# Patient Record
Sex: Female | Born: 1980 | Race: Black or African American | Hispanic: No | Marital: Single | Smoking: Never smoker
Health system: Southern US, Community
[De-identification: ages and names within clinical notes are randomized; demographics above are authoritative.]

## PROBLEM LIST (undated history)

## (undated) DIAGNOSIS — J45909 Unspecified asthma, uncomplicated: Secondary | ICD-10-CM

## (undated) HISTORY — PX: DILATION AND CURETTAGE OF UTERUS: SHX78

## (undated) HISTORY — PX: TONSILLECTOMY: SUR1361

---

## 2011-10-17 ENCOUNTER — Emergency Department (HOSPITAL_COMMUNITY): Payer: Medicaid Other

## 2011-10-17 ENCOUNTER — Encounter (HOSPITAL_COMMUNITY): Payer: Self-pay | Admitting: *Deleted

## 2011-10-17 ENCOUNTER — Other Ambulatory Visit: Payer: Self-pay

## 2011-10-17 ENCOUNTER — Emergency Department (HOSPITAL_COMMUNITY)
Admission: EM | Admit: 2011-10-17 | Discharge: 2011-10-18 | Disposition: A | Discharge status: Home / Self Care | Payer: Medicaid Other | Attending: Emergency Medicine | Admitting: Emergency Medicine

## 2011-10-17 DIAGNOSIS — R05 Cough: Secondary | ICD-10-CM | POA: Insufficient documentation

## 2011-10-17 DIAGNOSIS — J40 Bronchitis, not specified as acute or chronic: Secondary | ICD-10-CM

## 2011-10-17 DIAGNOSIS — R0602 Shortness of breath: Secondary | ICD-10-CM | POA: Insufficient documentation

## 2011-10-17 DIAGNOSIS — R42 Dizziness and giddiness: Secondary | ICD-10-CM | POA: Insufficient documentation

## 2011-10-17 DIAGNOSIS — R059 Cough, unspecified: Secondary | ICD-10-CM | POA: Insufficient documentation

## 2011-10-17 LAB — DIFFERENTIAL
Eosinophils Absolute: 0.1 10*3/uL (ref 0.0–0.7)
Eosinophils Relative: 2 % (ref 0–5)
Lymphocytes Relative: 22 % (ref 12–46)
Lymphs Abs: 0.9 10*3/uL (ref 0.7–4.0)
Monocytes Relative: 8 % (ref 3–12)

## 2011-10-17 LAB — CBC
HCT: 37.1 % (ref 36.0–46.0)
Hemoglobin: 13.2 g/dL (ref 12.0–15.0)
MCH: 30.6 pg (ref 26.0–34.0)
MCV: 85.9 fL (ref 78.0–100.0)
Platelets: 238 10*3/uL (ref 150–400)
RBC: 4.32 MIL/uL (ref 3.87–5.11)
WBC: 4.2 10*3/uL (ref 4.0–10.5)

## 2011-10-17 LAB — BASIC METABOLIC PANEL
BUN: 4 mg/dL — ABNORMAL LOW (ref 6–23)
CO2: 26 mEq/L (ref 19–32)
Calcium: 9.2 mg/dL (ref 8.4–10.5)
GFR calc non Af Amer: >90 mL/min (ref >90)
Glucose, Bld: 112 mg/dL — ABNORMAL HIGH (ref 70–99)
Potassium: 4.2 mEq/L (ref 3.5–5.1)
Sodium: 134 mEq/L — ABNORMAL LOW (ref 135–145)

## 2011-10-17 NOTE — ED Provider Notes (Signed)
History     CSN: [id number]  Arrival date & time 10/17/11  2129   First MD Initiated Contact with Patient 10/17/11 2155      Chief Complaint  Patient presents with  . Cough    (Consider location/radiation/quality/duration/timing/severity/associated sxs/prior treatment) HPI Comments: Patient presents with 3 weeks of cough, congestion, headache, chills. She has not been seen for these complaints. Her when her house is having symptoms which is not improved. Her cough is productive of yellowish mucus associated with anterior chest pain with coughing as well as chest soreness throughout the day. She denies any nausea, vomiting abdominal pain, back pain. He does not smoke. She is on birth control, denies any leg pain or swelling. She does see congestion, facial pain, sore throat, body aches and chills.  The history is provided by the patient.    History reviewed. No pertinent past medical history.  History reviewed. No pertinent past surgical history.  No family history on file.  History  Substance Use Topics  . Smoking status: Never Smoker   . Smokeless tobacco: Not on file  . Alcohol Use: Yes    OB History    Grav Para Term Preterm Abortions TAB SAB Ect Mult Living                  Review of Systems  Constitutional: Positive for chills, activity change, appetite change and fatigue.  HENT: Positive for congestion, sore throat, rhinorrhea and sinus pressure. Negative for neck pain and neck stiffness.   Eyes: Negative for visual disturbance.  Respiratory: Positive for cough and shortness of breath.   Cardiovascular: Negative for chest pain.  Gastrointestinal: Negative for nausea, vomiting and abdominal pain.  Genitourinary: Negative for dysuria and hematuria.  Musculoskeletal: Positive for myalgias and arthralgias. Negative for back pain.  Skin: Negative for rash.  Neurological: Positive for light-headedness and headaches.    Allergies  Review of patient's allergies  indicates no known allergies.  Home Medications   Current Outpatient Rx  Name Route Sig Dispense Refill  . NORGESTREL-ETHINYL ESTRADIOL 0.3-30 MG-MCG PO TABS Oral Take 1 tablet by mouth daily.    . ALBUTEROL SULFATE HFA 108 (90 BASE) MCG/ACT IN AERS Inhalation Inhale 1-2 puffs into the lungs every 6 (six) hours as needed for wheezing. 1 Inhaler 0  . AZITHROMYCIN 250 MG PO TABS Oral Take 1 tablet (250 mg total) by mouth daily. Take first 2 tablets together, then 1 every day until finished. 6 tablet 0  . BENZONATATE 200 MG PO CAPS Oral Take 1 capsule (200 mg total) by mouth 3 (three) times daily as needed for cough. 20 capsule 0    BP 124/81  Pulse 116  Temp(Src) 99.9 F (37.7 C) (Oral)  Resp 20  SpO2 99%  LMP 09/29/2011  Physical Exam  Constitutional: She is oriented to person, place, and time. She appears well-developed and well-nourished. No distress.  HENT:  Head: Normocephalic and atraumatic.  Right Ear: External ear normal.  Left Ear: External ear normal.  Mouth/Throat: Oropharynx is clear and moist. No oropharyngeal exudate.  Eyes: Conjunctivae are normal. Pupils are equal, round, and reactive to light.  Neck: Normal range of motion.       No meningismus  Cardiovascular: Normal rate, regular rhythm and normal heart sounds.   Pulmonary/Chest: Effort normal and breath sounds normal. No respiratory distress.  Abdominal: Soft. There is no tenderness. There is no rebound and no guarding.  Musculoskeletal: Normal range of motion. She exhibits no edema and  no tenderness.  Neurological: She is alert and oriented to person, place, and time. No cranial nerve deficit.  Skin: Skin is warm.    ED Course  Procedures (including critical care time)  Labs Reviewed  BASIC METABOLIC PANEL - Abnormal; Notable for the following:    Sodium 134 (*)    Glucose, Bld 112 (*)    BUN 4 (*)    All other components within normal limits  D-DIMER, QUANTITATIVE - Abnormal; Notable for the  following:    D-Dimer, Quant 0.69 (*)    All other components within normal limits  CBC  DIFFERENTIAL   Dg Chest 2 View  10/17/2011  *RADIOLOGY REPORT*  Clinical Data: Cough, congestion, shortness of breath, chest pressure  CHEST - 2 VIEW  Comparison: None  Findings: Normal heart size, mediastinal contours, and pulmonary vascularity. Lungs minimally hyperexpanded but clear. No pleural effusion or pneumothorax. Bilateral nipple shadows.  IMPRESSION: Minimally hyperexpanded lungs without infiltrate.  Original Report Authenticated By: Lollie Marrow, M.D.     1. Bronchitis       MDM  Cough, congestion, body aches and chills. Tachycardia on exam with clear lungs.   Suspect bronchitis given URI symptoms, cough. However with tachycardia, SOB, chest pain, and OCP use, Ddimer sent. Elevated.  CTPE pending at sign out to Dr. Lynelle Doctor.   Date: 10/17/2011  Rate: 105  Rhythm: sinus tachycardia  QRS Axis: normal  Intervals: normal  ST/T Wave abnormalities: normal  Conduction Disutrbances:none  Narrative Interpretation:   Old EKG Reviewed: none available          Glynn Octave, MD 10/18/11 [phone]

## 2011-10-17 NOTE — ED Notes (Signed)
Pt co very dry sore throat and has been sick on and off for 3 weeks.  Pain in center of chest that feels muscular to her and is intensified with coughing.  Pt states she had temp of 101 most of the day.  Alert oriented X4

## 2011-10-17 NOTE — ED Notes (Signed)
She has has a cold with coughing and she has a headache and chest congestion for 3 weeks.  Productive cough

## 2011-10-18 ENCOUNTER — Emergency Department (HOSPITAL_COMMUNITY): Payer: Medicaid Other

## 2011-10-18 MED ORDER — ALBUTEROL SULFATE HFA 108 (90 BASE) MCG/ACT IN AERS: 1.0000 | INHALATION_SPRAY | Freq: Four times a day (QID) | RESPIRATORY_TRACT | Status: DC | PRN

## 2011-10-18 MED ORDER — IOHEXOL 300 MG/ML  SOLN
80.0000 mL | Freq: Once | INTRAMUSCULAR | Status: AC | PRN
  Administered 2011-10-18: 80 mL via INTRAVENOUS

## 2011-10-18 MED ORDER — ACETAMINOPHEN 325 MG PO TABS
650.0000 mg | ORAL_TABLET | Freq: Once | ORAL | Status: AC
  Administered 2011-10-18: 650 mg via ORAL
  Filled 2011-10-18: qty 2

## 2011-10-18 MED ORDER — AZITHROMYCIN 250 MG PO TABS: 250.0000 mg | ORAL_TABLET | Freq: Every day | ORAL | Status: AC

## 2011-10-18 MED ORDER — BENZONATATE 200 MG PO CAPS: 200.0000 mg | ORAL_CAPSULE | Freq: Three times a day (TID) | ORAL | Status: AC | PRN

## 2011-10-18 NOTE — Discharge Instructions (Signed)

## 2011-10-18 NOTE — ED Provider Notes (Signed)
Pt turned over at change of shift to get CT angio chest results which are consistant with bronchitis (Pt low risk for bronchogenic cancer). Pt states she feels good enough to go home now.  Dg Chest 2 View  10/17/2011  *RADIOLOGY REPORT*  Clinical Data: Cough, congestion, shortness of breath, chest pressure  CHEST - 2 VIEW  Comparison: None  Findings: Normal heart size, mediastinal contours, and pulmonary vascularity. Lungs minimally hyperexpanded but clear. No pleural effusion or pneumothorax. Bilateral nipple shadows.  IMPRESSION: Minimally hyperexpanded lungs without infiltrate.  Original Report Authenticated By: Lollie Marrow, M.D.   Ct Angio Chest W/cm &/or Wo Cm  10/18/2011  *RADIOLOGY REPORT*  Clinical Data: Cough, question pulmonary embolism  CT ANGIOGRAPHY CHEST  Technique:  Multidetector CT imaging of the chest using the standard protocol during bolus administration of intravenous contrast. Multiplanar reconstructed images including MIPs were obtained and reviewed to evaluate the vascular anatomy.  Contrast: 80mL OMNIPAQUE IOHEXOL 300 MG/ML IJ SOLN  Comparison: None  Findings: Aorta normal caliber without aneurysm or dissection. Visualized portion of upper abdomen unremarkable. Pulmonary arteries patent. No evidence of pulmonary embolism. Scattered peribronchial thickening. Minimal patchy peribronchovascular infiltrate right lung and left lower lobe. Subpleural atelectasis right lower lobe. Few tiny nonspecific subpleural nodules 4 mm diameter or less. Bones unremarkable.  IMPRESSION: No evidence of pulmonary embolism. Scattered peribronchial thickening with minimal scattered pulmonary infiltrate. Tiny nodular foci left lung loss of 4 mm diameter, recommendation below.  If the patient is at high risk for bronchogenic carcinoma, follow- up chest CT at 1 year is recommended.  If the patient is at low risk, no follow-up is needed.  This recommendation follows the consensus statement: Guidelines for  Management of Small Pulmonary Nodules Detected on CT Scans:  A Statement from the Fleischner Society as published in Radiology 2005; 237:395-400.  Original Report Authenticated By: Lollie Marrow, M.D.    Diagnoses that have been ruled out:  None  Diagnoses that are still under consideration:  None  Final diagnoses:  Bronchitis   New Prescriptions   ALBUTEROL (PROVENTIL HFA;VENTOLIN HFA) 108 (90 BASE) MCG/ACT INHALER    Inhale 1-2 puffs into the lungs every 6 (six) hours as needed for wheezing.   AZITHROMYCIN (ZITHROMAX) 250 MG TABLET    Take 1 tablet (250 mg total) by mouth daily. Take first 2 tablets together, then 1 every day until finished.   BENZONATATE (TESSALON) 200 MG CAPSULE    Take 1 capsule (200 mg total) by mouth 3 (three) times daily as needed for cough.   Plan discharge Devoria Albe, MD, Franz Dell, MD 10/18/11 [phone]

## 2012-08-07 ENCOUNTER — Emergency Department (HOSPITAL_BASED_OUTPATIENT_CLINIC_OR_DEPARTMENT_OTHER)
Admission: EM | Admit: 2012-08-07 | Discharge: 2012-08-07 | Disposition: A | Discharge status: Home / Self Care | Payer: Medicaid Other | Attending: Emergency Medicine | Admitting: Emergency Medicine

## 2012-08-07 ENCOUNTER — Encounter (HOSPITAL_BASED_OUTPATIENT_CLINIC_OR_DEPARTMENT_OTHER): Payer: Self-pay | Admitting: *Deleted

## 2012-08-07 DIAGNOSIS — B349 Viral infection, unspecified: Secondary | ICD-10-CM

## 2012-08-07 DIAGNOSIS — Z9889 Other specified postprocedural states: Secondary | ICD-10-CM | POA: Insufficient documentation

## 2012-08-07 DIAGNOSIS — Z8701 Personal history of pneumonia (recurrent): Secondary | ICD-10-CM | POA: Insufficient documentation

## 2012-08-07 DIAGNOSIS — Z8709 Personal history of other diseases of the respiratory system: Secondary | ICD-10-CM | POA: Insufficient documentation

## 2012-08-07 DIAGNOSIS — J069 Acute upper respiratory infection, unspecified: Secondary | ICD-10-CM | POA: Insufficient documentation

## 2012-08-07 DIAGNOSIS — IMO0002 Reserved for concepts with insufficient information to code with codable children: Secondary | ICD-10-CM | POA: Insufficient documentation

## 2012-08-07 DIAGNOSIS — IMO0001 Reserved for inherently not codable concepts without codable children: Secondary | ICD-10-CM | POA: Insufficient documentation

## 2012-08-07 DIAGNOSIS — R1310 Dysphagia, unspecified: Secondary | ICD-10-CM | POA: Insufficient documentation

## 2012-08-07 DIAGNOSIS — Z79899 Other long term (current) drug therapy: Secondary | ICD-10-CM | POA: Insufficient documentation

## 2012-08-07 DIAGNOSIS — R61 Generalized hyperhidrosis: Secondary | ICD-10-CM | POA: Insufficient documentation

## 2012-08-07 DIAGNOSIS — B9789 Other viral agents as the cause of diseases classified elsewhere: Secondary | ICD-10-CM | POA: Insufficient documentation

## 2012-08-07 LAB — RAPID STREP SCREEN (MED CTR MEBANE ONLY): Streptococcus, Group A Screen (Direct): NEGATIVE

## 2012-08-07 MED ORDER — PSEUDOEPHEDRINE HCL 60 MG PO TABS: 60.0000 mg | ORAL_TABLET | Freq: Four times a day (QID) | ORAL | Status: AC | PRN

## 2012-08-07 MED ORDER — ACETAMINOPHEN 500 MG PO TABS: 1000.0000 mg | ORAL_TABLET | Freq: Three times a day (TID) | ORAL | Status: AC | PRN

## 2012-08-07 MED ORDER — DIPHENHYDRAMINE HCL 25 MG PO CAPS: 25.0000 mg | ORAL_CAPSULE | Freq: Four times a day (QID) | ORAL | Status: DC | PRN

## 2012-08-07 MED ORDER — IBUPROFEN 800 MG PO TABS
800.0000 mg | ORAL_TABLET | Freq: Once | ORAL | Status: AC
  Administered 2012-08-07: 800 mg via ORAL
  Filled 2012-08-07: qty 1

## 2012-08-07 MED ORDER — IBUPROFEN 800 MG PO TABS: 800.0000 mg | ORAL_TABLET | Freq: Three times a day (TID) | ORAL | Status: DC | PRN

## 2012-08-07 NOTE — ED Notes (Signed)
MD at bedside. 

## 2012-08-07 NOTE — ED Provider Notes (Signed)
History   This chart was scribed for Jones Skene, MD by Sofie Rower, ED Scribe. The patient was seen in room MH07/MH07 and the patient's care was started at 7:22PM   CSN: [id number]  Arrival date & time 08/07/12  1836   First MD Initiated Contact with Patient 08/07/12 1922      Chief Complaint  Patient presents with  . URI    (Consider location/radiation/quality/duration/timing/severity/associated sxs/prior treatment) The history is provided by the patient. No language interpreter was used.    Loretta Warren is a 31 y.o. female with a hx of tonsillectomy, who presents to the Emergency Department complaining of sudden, progressively worsening, sore throat, onset four days ago (08/03/12).  Associated symptoms include myalgias, sweats and difficulty swallowing. The pt has taken motrin (200 mg X 2) which provides moderate relief of the sore throat. The pt has also taken antihistamine which does not provide relief of the sore throat. Modifying factors include swallowing which intensifies the sore throat. The pt has additional hx of acute pneumonia (diagnosed last year), acute bronchitis (diagnosed last year), cesarean section and dilation and curettage of uterus.   The pt denies cough, abdominal pain, nausea, and vomiting.  The pt does not smoke, however she does drink alcohol. The pt's LNMP was 07/11/12.     History reviewed. No pertinent past medical history.  Past Surgical History  Procedure Date  . Tonsillectomy   . Cesarean section   . Dilation and curettage of uterus     History reviewed. No pertinent family history.  History  Substance Use Topics  . Smoking status: Never Smoker   . Smokeless tobacco: Not on file  . Alcohol Use: Yes    OB History    Grav Para Term Preterm Abortions TAB SAB Ect Mult Living                  Review of Systems  At least 10pt or greater review of systems completed and are negative except where specified in the HPI.   Allergies   Review of patient's allergies indicates no known allergies.  Home Medications   Current Outpatient Rx  Name  Route  Sig  Dispense  Refill  . ALBUTEROL SULFATE HFA 108 (90 BASE) MCG/ACT IN AERS   Inhalation   Inhale 1-2 puffs into the lungs every 6 (six) hours as needed for wheezing.   1 Inhaler   0   . NORGESTREL-ETHINYL ESTRADIOL 0.3-30 MG-MCG PO TABS   Oral   Take 1 tablet by mouth daily.           BP 111/74  Pulse 119  Temp 99.5 F (37.5 C)  Resp 16  Ht 5\' 10"  (1.778 m)  Wt 145 lb (65.772 kg)  BMI 20.81 kg/m2  SpO2 100%  LMP 07/11/2012  Physical Exam  Nursing notes reviewed.  Electronic medical record reviewed. VITAL SIGNS:   Filed Vitals:   08/07/12 1842  BP: 111/74  Pulse: 119  Temp: 99.5 F (37.5 C)  Resp: 16  Height: 5\' 10"  (1.778 m)  Weight: 145 lb (65.772 kg)  SpO2: 100%   CONSTITUTIONAL: Awake, oriented, appears non-toxic HENT: Atraumatic, normocephalic, oral mucosa pink and moist, airway patent. Nasal congestion with clear nasal drainage. Pharynx mildly erythematous without exudates. External ears normal. EYES: Conjunctiva clear, EOMI, PERRLA NECK: Trachea midline, non-tender, supple, some mild anterior lymphadenopathy CARDIOVASCULAR: Normal heart rate, Normal rhythm, No murmurs, rubs, gallops PULMONARY/CHEST: Clear to auscultation, no rhonchi, wheezes, or rales. Symmetrical breath sounds.  Non-tender. ABDOMINAL: Non-distended, soft, non-tender - no rebound or guarding.  BS normal. NEUROLOGIC: Non-focal, moving all four extremities, no gross sensory or motor deficits. EXTREMITIES: No clubbing, cyanosis, or edema SKIN: Warm, Dry, No erythema, No rash  ED Course  Procedures (including critical care time)  DIAGNOSTIC STUDIES: Oxygen Saturation is 100% on room air, normal by my interpretation.    COORDINATION OF CARE:  7:43 PM- Treatment plan discussed with patient. Pt agrees with treatment.    Labs Reviewed  RAPID STREP SCREEN   Dg Chest  2 View  08/12/2012  *RADIOLOGY REPORT*  Clinical Data: Cough.  Chest and back pain.  CHEST - 2 VIEW  Comparison:  10/17/2011  Findings:  The heart size and mediastinal contours are within normal limits.  Both lungs are clear.  The visualized skeletal structures are unremarkable.  IMPRESSION: No active cardiopulmonary disease.   Original Report Authenticated By: Myles Rosenthal, M.D.      1. URI, acute   2. Viral syndrome      Medications  ibuprofen (ADVIL,MOTRIN) 800 MG tablet (not administered)  acetaminophen (TYLENOL) 500 MG tablet (not administered)  pseudoephedrine (SUDAFED) 60 MG tablet (not administered)  diphenhydrAMINE (BENADRYL) 25 mg capsule (not administered)  ibuprofen (ADVIL,MOTRIN) tablet 800 mg (800 mg Oral Given 08/07/12 2008)     MDM  Loretta Warren is a 31 y.o. female presenting with viral URI. Afebrile and nontoxic at this time. Pulse rate normal on physician exam. We'll treat the patient's symptoms with ibuprofen, Tylenol, Sudafed and Benadryl.  I explained the diagnosis and have given explicit precautions to return to the ER including any other new or worsening symptoms. The patient understands and accepts the medical plan as it's been dictated and I have answered their questions. Discharge instructions concerning home care and prescriptions have been given.  The patient is STABLE and is discharged to home in good condition.  I personally performed the services described in this documentation, which was scribed in my presence. The recorded information has been reviewed and is accurate. Jones Skene, M.D.      Jones Skene, MD 08/13/12 [phone]

## 2012-08-07 NOTE — ED Notes (Signed)
Pt c/o uri symptoms x 4 days

## 2012-08-10 ENCOUNTER — Emergency Department (HOSPITAL_COMMUNITY)
Admission: EM | Admit: 2012-08-10 | Discharge: 2012-08-10 | Discharge status: Against Medical Advice | Payer: Medicaid Other | Attending: Emergency Medicine | Admitting: Emergency Medicine

## 2012-08-10 ENCOUNTER — Encounter (HOSPITAL_COMMUNITY): Payer: Self-pay | Admitting: Emergency Medicine

## 2012-08-10 DIAGNOSIS — J3489 Other specified disorders of nose and nasal sinuses: Secondary | ICD-10-CM | POA: Insufficient documentation

## 2012-08-10 DIAGNOSIS — R509 Fever, unspecified: Secondary | ICD-10-CM | POA: Insufficient documentation

## 2012-08-10 NOTE — ED Notes (Signed)
Patients states she has had sore throat, fever, congestion and cold symptoms since last week. States she now feels like she may have "pneumonia" because its hard for her to catch her breath at times. States she is also have back pain and aches all over.

## 2012-08-10 NOTE — ED Notes (Signed)
Pt stated she was leaving due to wait, encouraged to stay.

## 2012-08-10 NOTE — ED Notes (Signed)
Pt. is in Peds ED room 2 and will return to Adult ED Triage once daughter is seen.

## 2012-08-12 ENCOUNTER — Emergency Department (HOSPITAL_COMMUNITY)
Admission: EM | Admit: 2012-08-12 | Discharge: 2012-08-12 | Disposition: A | Discharge status: Home / Self Care | Payer: Medicaid Other | Attending: Emergency Medicine | Admitting: Emergency Medicine

## 2012-08-12 ENCOUNTER — Encounter (HOSPITAL_COMMUNITY): Payer: Self-pay | Admitting: *Deleted

## 2012-08-12 ENCOUNTER — Emergency Department (HOSPITAL_COMMUNITY): Payer: Medicaid Other

## 2012-08-12 DIAGNOSIS — M545 Low back pain, unspecified: Secondary | ICD-10-CM | POA: Insufficient documentation

## 2012-08-12 DIAGNOSIS — H9209 Otalgia, unspecified ear: Secondary | ICD-10-CM | POA: Insufficient documentation

## 2012-08-12 DIAGNOSIS — R509 Fever, unspecified: Secondary | ICD-10-CM | POA: Insufficient documentation

## 2012-08-12 DIAGNOSIS — Z79899 Other long term (current) drug therapy: Secondary | ICD-10-CM | POA: Insufficient documentation

## 2012-08-12 DIAGNOSIS — J029 Acute pharyngitis, unspecified: Secondary | ICD-10-CM | POA: Insufficient documentation

## 2012-08-12 DIAGNOSIS — B349 Viral infection, unspecified: Secondary | ICD-10-CM

## 2012-08-12 DIAGNOSIS — B9789 Other viral agents as the cause of diseases classified elsewhere: Secondary | ICD-10-CM | POA: Insufficient documentation

## 2012-08-12 MED ORDER — SODIUM CHLORIDE 0.9 % IV BOLUS (SEPSIS)
1000.0000 mL | Freq: Once | INTRAVENOUS | Status: AC
  Administered 2012-08-12: 1000 mL via INTRAVENOUS

## 2012-08-12 MED ORDER — OXYCODONE-ACETAMINOPHEN 5-325 MG PO TABS: 1.0000 | ORAL_TABLET | Freq: Four times a day (QID) | ORAL | Status: DC | PRN

## 2012-08-12 MED ORDER — KETOROLAC TROMETHAMINE 30 MG/ML IJ SOLN
30.0000 mg | Freq: Once | INTRAMUSCULAR | Status: AC
Start: 2012-08-12 — End: 2012-08-12
  Administered 2012-08-12: 30 mg via INTRAVENOUS
  Filled 2012-08-12: qty 1

## 2012-08-12 NOTE — ED Provider Notes (Signed)
History   This chart was scribed for [employer]. Rubin Payor, MD by Charolett Bumpers, ED Scribe. The patient was seen in room TR05C/TR05C. Patient's care was started at 1540.   CSN: [id number]  Arrival date & time 08/12/12  1247   First MD Initiated Contact with Patient 08/12/12 1540      Chief Complaint  Patient presents with  . Cough  . Back Pain    The history is provided by the patient. No language interpreter was used.  Loretta Warren is a 31 y.o. female who presents to the Emergency Department complaining of persistent, moderate productive cough for the past week with associated ear pain, fevers, sore throat and back pain. She noted blood tinged sputum at times. She states that she was seen at [facility] for the same. She states she was told she has fluid in her ears and was told to take Benadryl, sudafed, tylenol and motrin. She states that she has felt worse since then. She states that she now has right sided lower back pain that worsens with inspiration. She states that her right ear started hurting again yesterday. She states that she is otherwise normally healthy. She denies smoking. She denies any vomiting and diarrhea.   History reviewed. No pertinent past medical history.  Past Surgical History  Procedure Date  . Tonsillectomy   . Cesarean section   . Dilation and curettage of uterus     History reviewed. No pertinent family history.  History  Substance Use Topics  . Smoking status: Never Smoker   . Smokeless tobacco: Not on file  . Alcohol Use: Yes    OB History    Grav Para Term Preterm Abortions TAB SAB Ect Mult Living                  Review of Systems  Constitutional: Positive for fever.  HENT: Positive for ear pain and sore throat.   Respiratory: Positive for cough.   Gastrointestinal: Negative for vomiting and diarrhea.  Musculoskeletal: Positive for back pain.  All other systems reviewed and are negative.    Allergies  Review of patient's  allergies indicates no known allergies.  Home Medications   Current Outpatient Rx  Name  Route  Sig  Dispense  Refill  . ACETAMINOPHEN 500 MG PO TABS   Oral   Take 2 tablets (1,000 mg total) by mouth every 8 (eight) hours as needed for pain or fever.   30 tablet   0   . DIPHENHYDRAMINE HCL 25 MG PO TABS   Oral   Take 25-50 mg by mouth every 6 (six) hours as needed. For sleep         . IBUPROFEN 800 MG PO TABS   Oral   Take 1 tablet (800 mg total) by mouth every 8 (eight) hours as needed for pain or fever.   21 tablet   0   . NORGESTREL-ETHINYL ESTRADIOL 0.3-30 MG-MCG PO TABS   Oral   Take 1 tablet by mouth daily.         Marland Kitchen PSEUDOEPHEDRINE HCL 60 MG PO TABS   Oral   Take 1 tablet (60 mg total) by mouth every 6 (six) hours as needed for congestion.   30 tablet   0   . OXYCODONE-ACETAMINOPHEN 5-325 MG PO TABS   Oral   Take 1-2 tablets by mouth every 6 (six) hours as needed for pain.   10 tablet   0     BP 107/72  Pulse 91  Temp 98.4 F (36.9 C) (Oral)  Resp 18  SpO2 98%  LMP 08/10/2012  Physical Exam  Nursing note and vitals reviewed. Constitutional: She is oriented to person, place, and time. She appears well-developed and well-nourished. No distress.       Appears uncomfortable.   HENT:  Head: Normocephalic and atraumatic.  Mouth/Throat: Posterior oropharyngeal erythema present. No oropharyngeal exudate.       Minimal effusion to bilateral TM's.   Eyes: EOM are normal.  Neck: Neck supple. No tracheal deviation present.       Anterior cervical lymphadenopathy.   Cardiovascular: Normal rate, regular rhythm and normal heart sounds.   Pulmonary/Chest: Effort normal and breath sounds normal. No respiratory distress.  Abdominal: Soft. There is no tenderness.  Musculoskeletal: Normal range of motion.  Lymphadenopathy:    She has cervical adenopathy.  Neurological: She is alert and oriented to person, place, and time.  Skin: Skin is warm and dry.    Psychiatric: She has a normal mood and affect. Her behavior is normal.    ED Course  Procedures (including critical care time)   COORDINATION OF CARE:  15:45-Discussed planned course of treatment with the patient including reviewing old records, who is agreeable at this time. Pt has had a chest x-ray while waiting that is negative.    Labs Reviewed - No data to display Dg Chest 2 View  08/12/2012  *RADIOLOGY REPORT*  Clinical Data: Cough.  Chest and back pain.  CHEST - 2 VIEW  Comparison:  10/17/2011  Findings:  The heart size and mediastinal contours are within normal limits.  Both lungs are clear.  The visualized skeletal structures are unremarkable.  IMPRESSION: No active cardiopulmonary disease.   Original Report Authenticated By: Myles Rosenthal, M.D.      1. Viral infection       MDM  Patient with URI symptoms and sore throat. Recent negative strep test. Patient had some tachycardia and weakness. She feels better after treatment IV fluids and toradol. Will d/c.    I personally performed the services described in this documentation, which was scribed in my presence. The recorded information has been reviewed and is accurate.       Juliet Rude. Rubin Payor, MD 08/12/12 1724

## 2012-08-12 NOTE — ED Notes (Signed)
Pt has multiple complaints. Having sore throat x 1 week, had right side throat swelling, went to [facility] for same and told she had fluid on her ears, took the meds but it caused gi upset. Still having right ear pain but now also having right side back pain that increases with inspiration and reports blood tinged sputum/drainage that is either coming from her nose or ear? Airway intact at triage.

## 2013-09-14 IMAGING — CR DG CHEST 2V
2 series · 2 of 2 positions shown · non-contrast
Comparison: None

CLINICAL DATA: Cough, congestion, shortness of breath, chest
pressure

CHEST - 2 VIEW

[w chest pa]
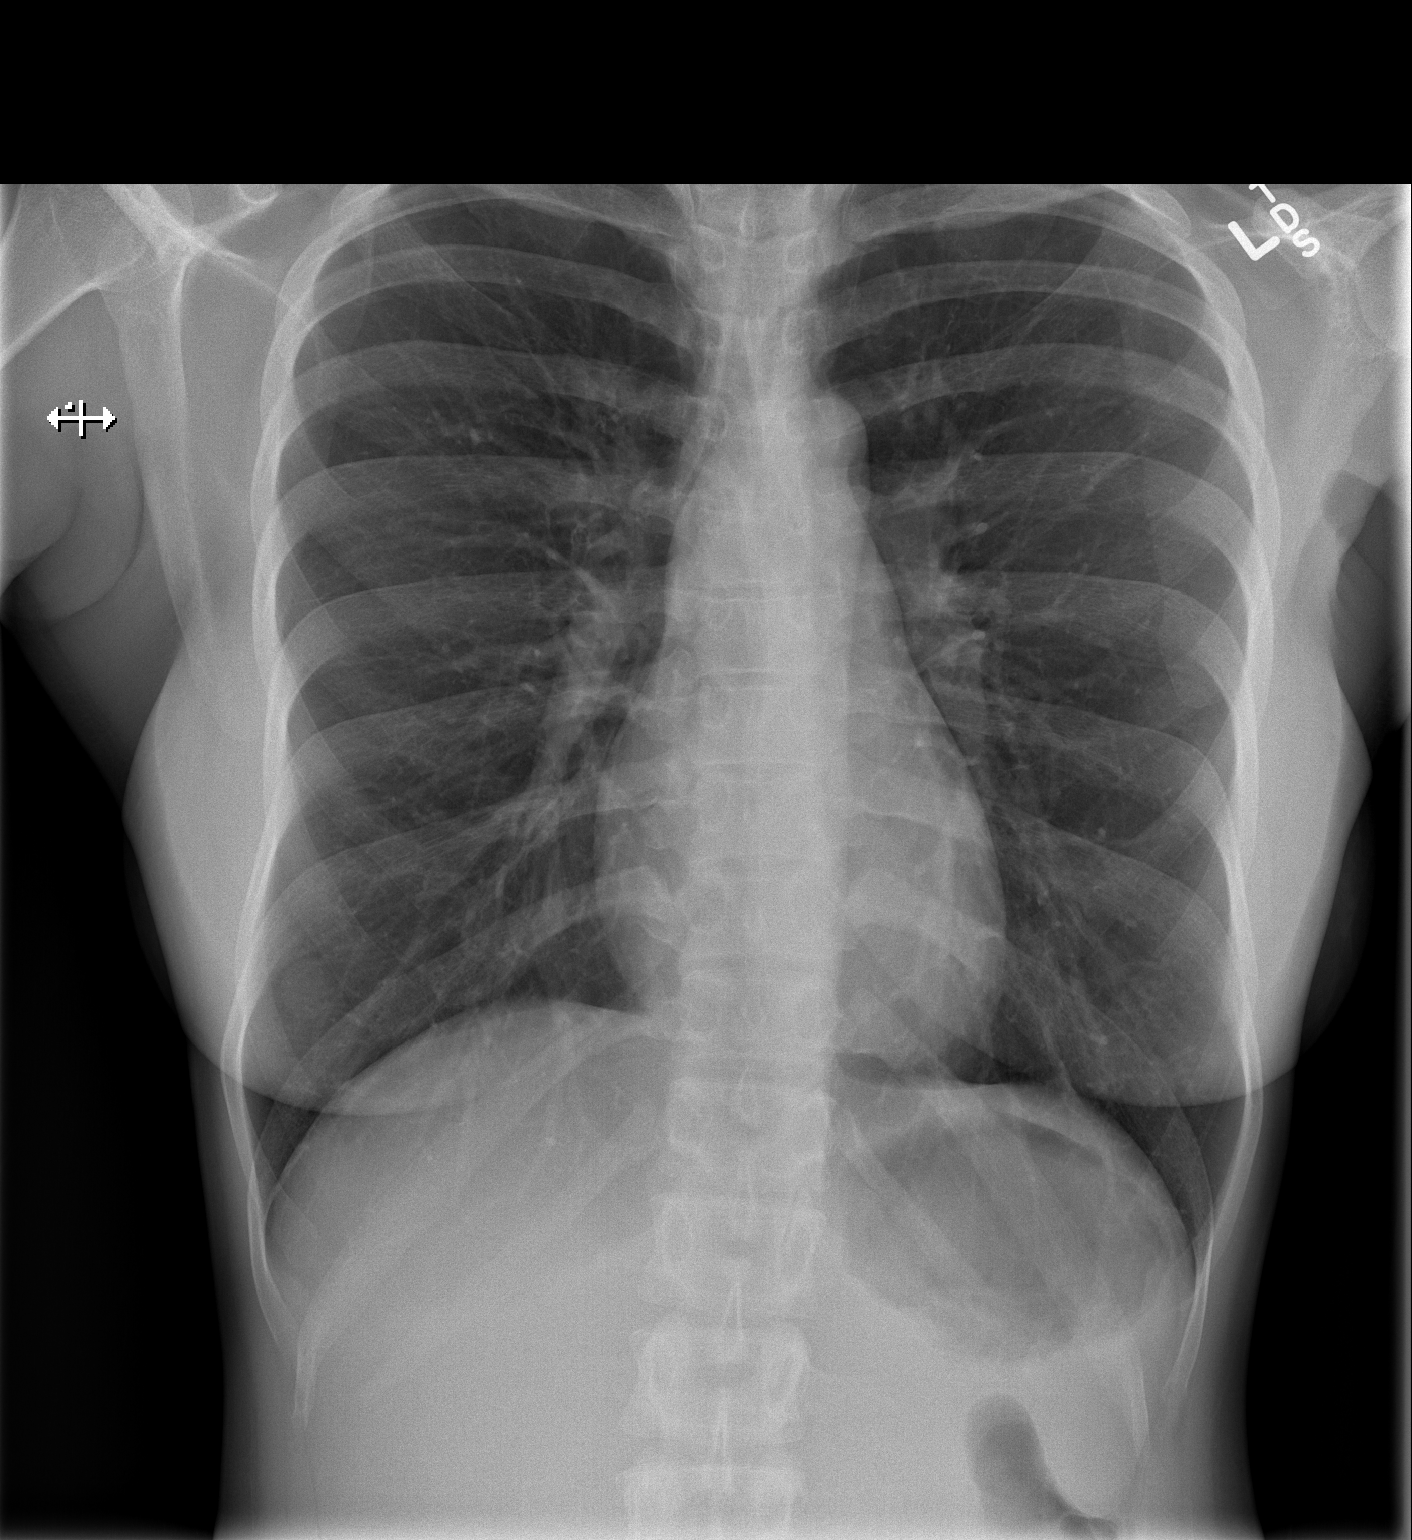

[w chest lat]
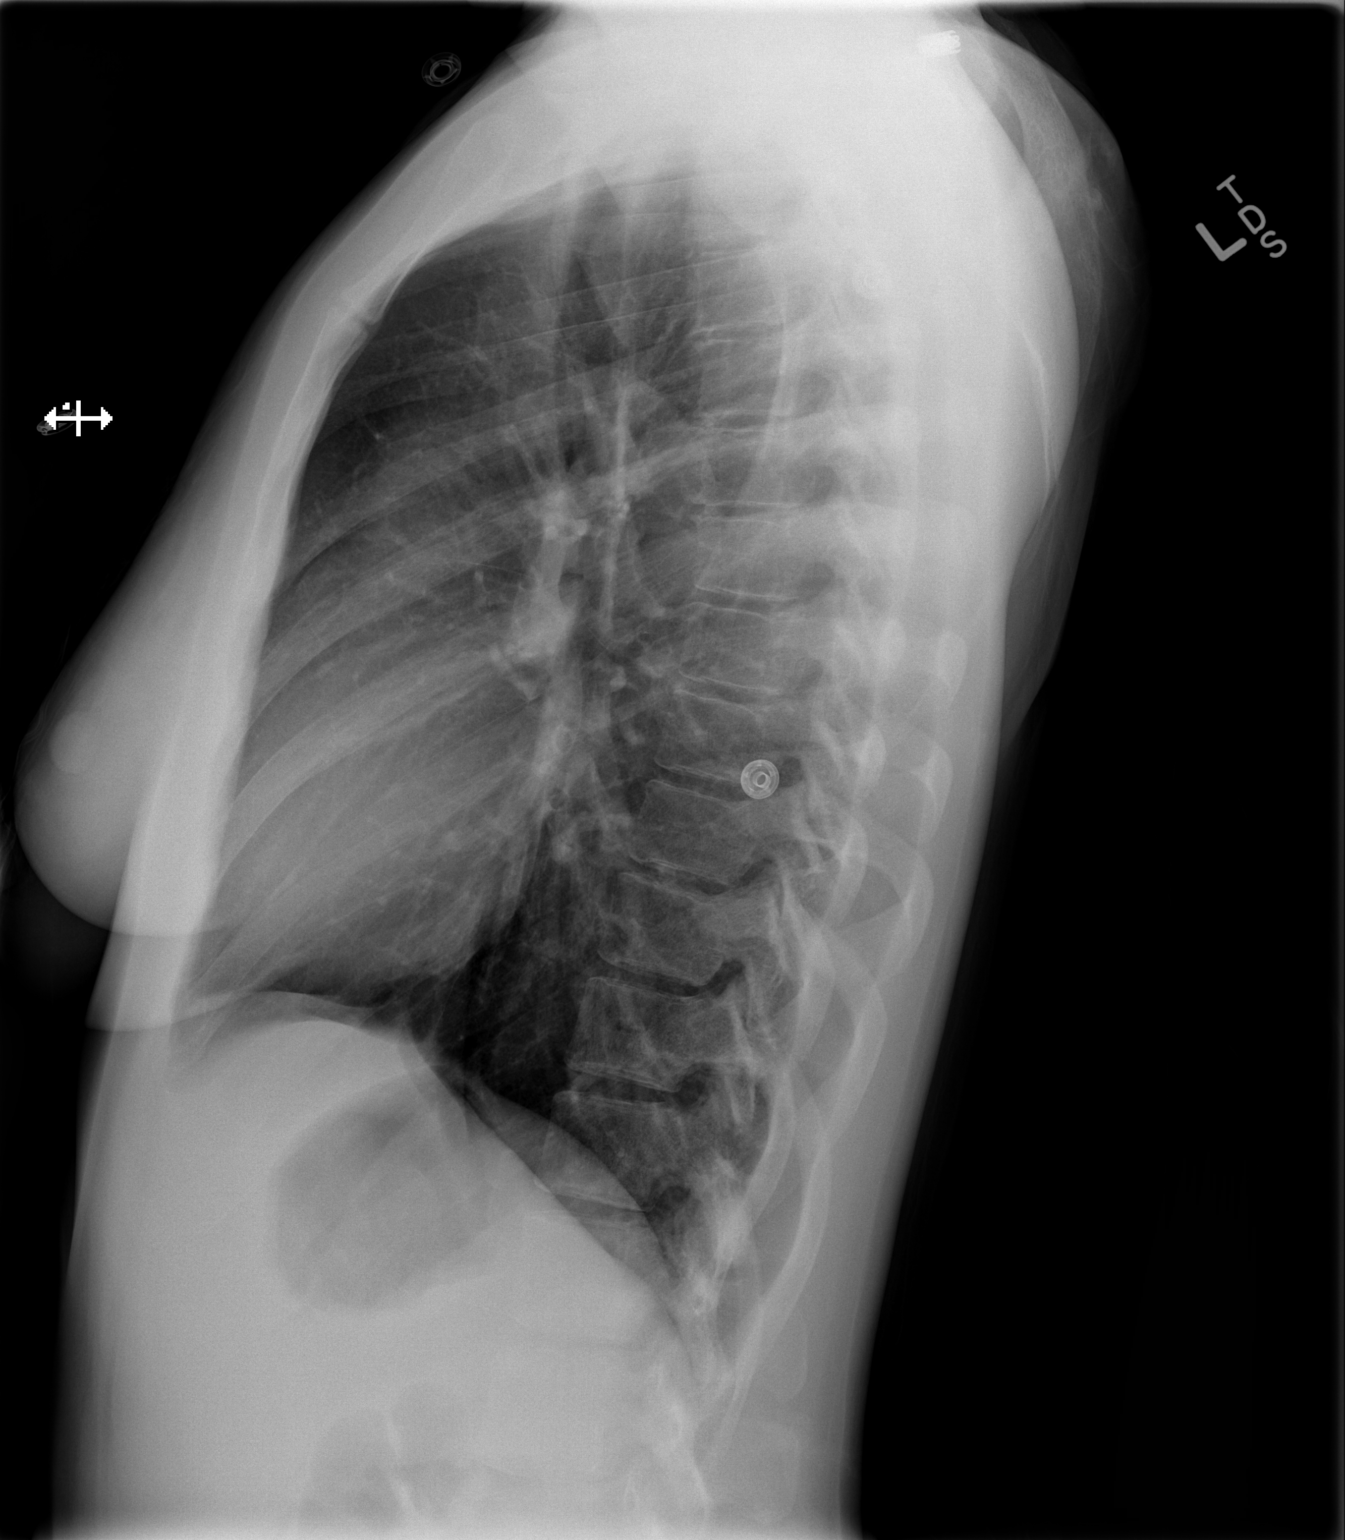

[2 of 2 positions shown; findings below may reference images not displayed]

FINDINGS: Normal heart size, mediastinal contours, and pulmonary vascularity.
Lungs minimally hyperexpanded but clear.
No pleural effusion or pneumothorax.
Bilateral nipple shadows.
IMPRESSION: Minimally hyperexpanded lungs without infiltrate.

## 2014-07-11 IMAGING — CR DG CHEST 2V
2 series · 2 of 2 positions shown · non-contrast
Comparison: 10/17/2011

CLINICAL DATA: Cough.  Chest and back pain.

CHEST - 2 VIEW

[w chest pa]
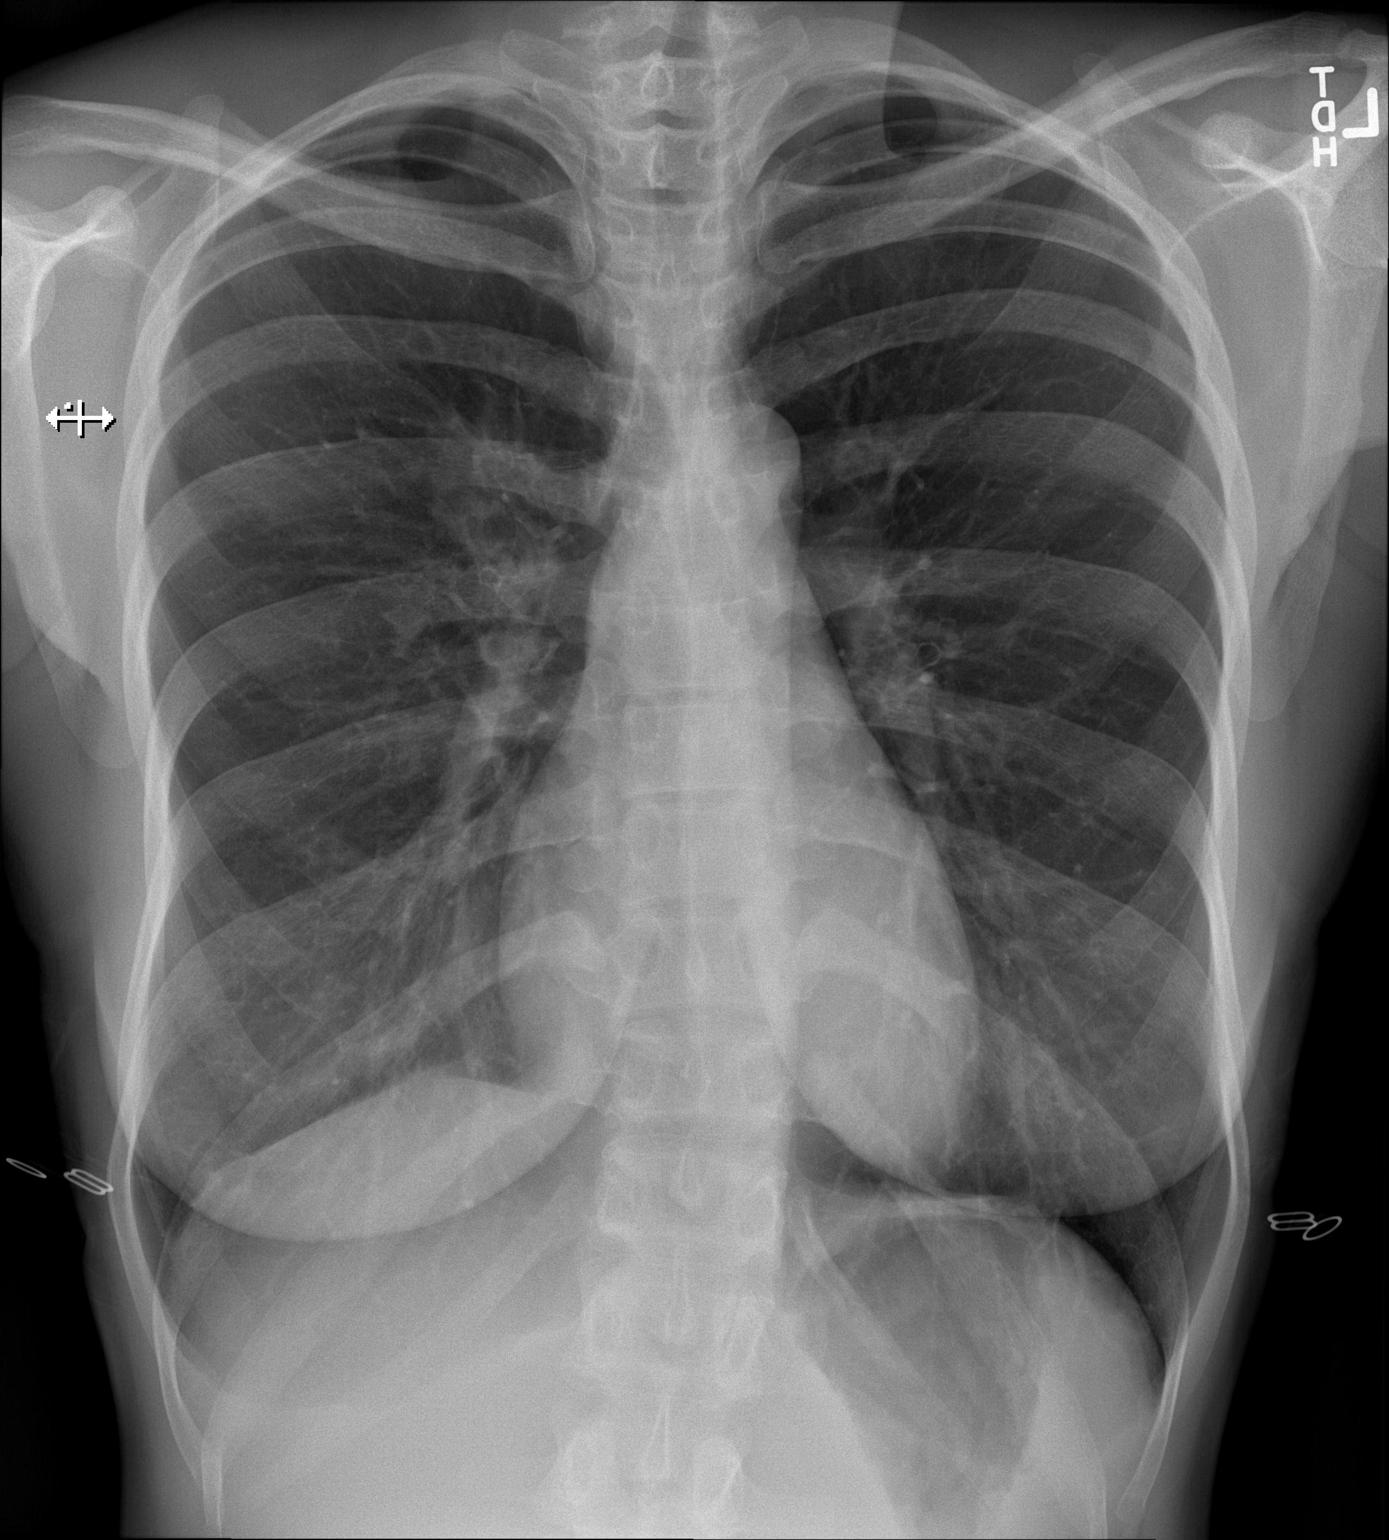

[w chest lat]
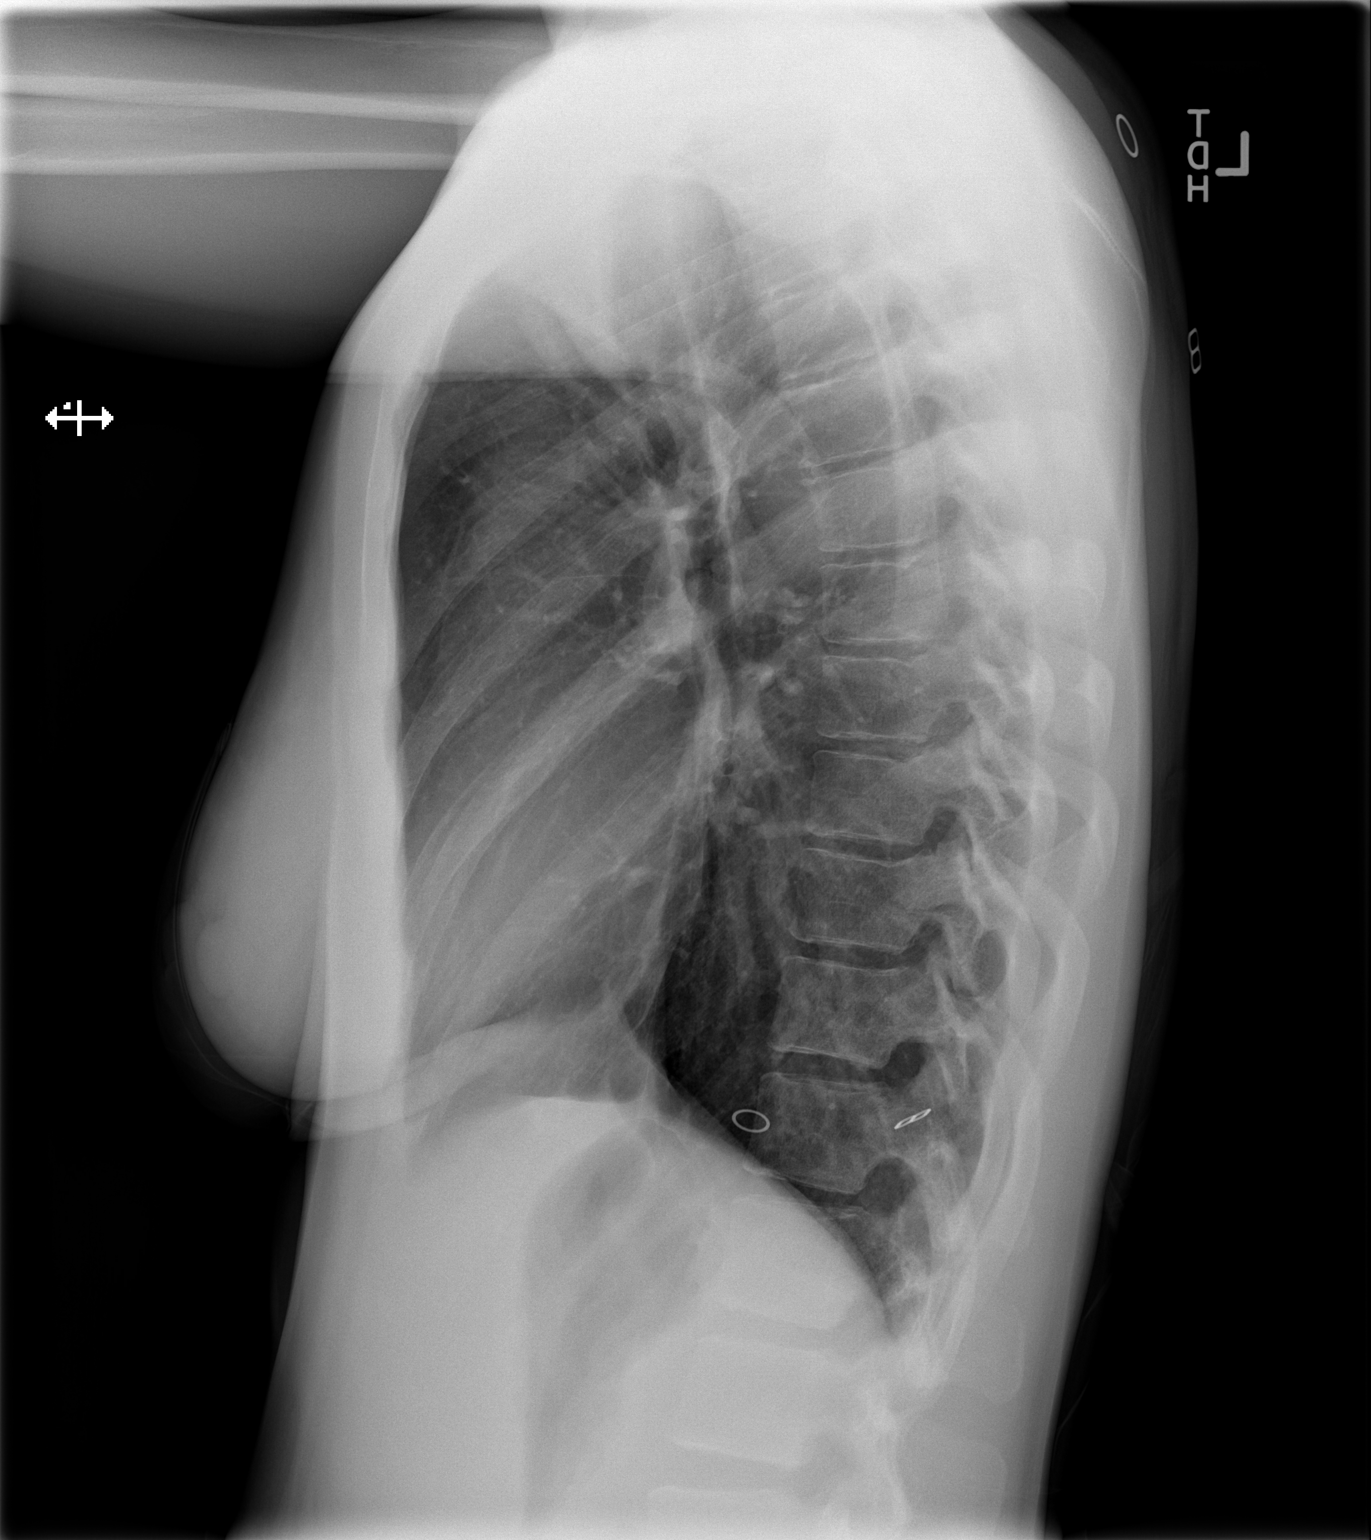

[2 of 2 positions shown; findings below may reference images not displayed]

FINDINGS: The heart size and mediastinal contours are within
normal limits.  Both lungs are clear.  The visualized skeletal
structures are unremarkable.
IMPRESSION: No active cardiopulmonary disease.

## 2014-11-16 ENCOUNTER — Emergency Department (HOSPITAL_COMMUNITY)
Admission: EM | Admit: 2014-11-16 | Discharge: 2014-11-17 | Disposition: A | Discharge status: Home / Self Care | Payer: Medicaid Other | Attending: Emergency Medicine | Admitting: Emergency Medicine

## 2014-11-16 ENCOUNTER — Encounter (HOSPITAL_COMMUNITY): Payer: Self-pay | Admitting: Adult Health

## 2014-11-16 DIAGNOSIS — H938X9 Other specified disorders of ear, unspecified ear: Secondary | ICD-10-CM | POA: Insufficient documentation

## 2014-11-16 DIAGNOSIS — J0100 Acute maxillary sinusitis, unspecified: Secondary | ICD-10-CM | POA: Insufficient documentation

## 2014-11-16 DIAGNOSIS — J069 Acute upper respiratory infection, unspecified: Secondary | ICD-10-CM

## 2014-11-16 DIAGNOSIS — R51 Headache: Secondary | ICD-10-CM

## 2014-11-16 DIAGNOSIS — Z791 Long term (current) use of non-steroidal anti-inflammatories (NSAID): Secondary | ICD-10-CM | POA: Diagnosis not present

## 2014-11-16 DIAGNOSIS — Z79899 Other long term (current) drug therapy: Secondary | ICD-10-CM | POA: Insufficient documentation

## 2014-11-16 DIAGNOSIS — R519 Headache, unspecified: Secondary | ICD-10-CM

## 2014-11-16 DIAGNOSIS — H53149 Visual discomfort, unspecified: Secondary | ICD-10-CM | POA: Diagnosis not present

## 2014-11-16 NOTE — ED Notes (Signed)
Pt states, "I have been sick since Wednesday, I have all this pressure in my ears, face and up in my head. I have been taking BC but it is not helping. I am unable to eat much. I have been taking theraflu and it was helping, this AM I got up and went to work and ate breakfast, I didn't take anything, this afternoon I couldn't function because my head hurt so bad, but its not a headache its like a pressure and popping on my left nostril and my ears are always popping, it hurts so badly, if I lay on my left side the pain goes away on the left but if I lay on my right side the pain does not go away. I can't take it anymore and nothing is working. I had fevers but they went away. My bronchitis is acting up and when I cough the pressure in head is so bad I can't cough when I need to. The light doesn't bother me. I suffer from migraines and this does not feel like a migraine. I had double vision today and I was seeing things today like the wall was pulsating. The headache gradually came on" Noise does not bother her. Pt alert and oriented able to answer all questions,

## 2014-11-17 MED ORDER — HYDROCOD POLST-CHLORPHEN POLST 10-8 MG/5ML PO LQCR: 5.0000 mL | Freq: Two times a day (BID) | ORAL | Status: AC | PRN

## 2014-11-17 MED ORDER — METOCLOPRAMIDE HCL 5 MG/ML IJ SOLN
10.0000 mg | Freq: Once | INTRAMUSCULAR | Status: AC
  Administered 2014-11-17: 10 mg via INTRAVENOUS
  Filled 2014-11-17: qty 2

## 2014-11-17 MED ORDER — KETOROLAC TROMETHAMINE 15 MG/ML IJ SOLN
15.0000 mg | Freq: Once | INTRAMUSCULAR | Status: AC
  Administered 2014-11-17: 15 mg via INTRAVENOUS
  Filled 2014-11-17: qty 1

## 2014-11-17 MED ORDER — SODIUM CHLORIDE 0.9 % IV BOLUS (SEPSIS)
1000.0000 mL | Freq: Once | INTRAVENOUS | Status: AC
  Administered 2014-11-17: 1000 mL via INTRAVENOUS

## 2014-11-17 MED ORDER — DIPHENHYDRAMINE HCL 50 MG/ML IJ SOLN
25.0000 mg | Freq: Once | INTRAMUSCULAR | Status: AC
  Administered 2014-11-17: 25 mg via INTRAVENOUS
  Filled 2014-11-17: qty 1

## 2014-11-17 MED ORDER — AMOXICILLIN-POT CLAVULANATE 875-125 MG PO TABS: 1.0000 | ORAL_TABLET | Freq: Two times a day (BID) | ORAL | Status: AC

## 2014-11-17 NOTE — Discharge Instructions (Signed)
General Headache Without Cause A headache is pain or discomfort felt around the head or neck area. The specific cause of a headache may not be found. There are many causes and types of headaches. A few common ones are:  Tension headaches.  Migraine headaches.  Cluster headaches.  Chronic daily headaches. HOME CARE INSTRUCTIONS   Keep all follow-up appointments with your caregiver or any specialist referral.  Only take over-the-counter or prescription medicines for pain or discomfort as directed by your caregiver.  Lie down in a dark, quiet room when you have a headache.  Keep a headache journal to find out what may trigger your migraine headaches. For example, write down:  What you eat and drink.  How much sleep you get.  Any change to your diet or medicines.  Try massage or other relaxation techniques.  Put ice packs or heat on the head and neck. Use these 3 to 4 times per day for 15 to 20 minutes each time, or as needed.  Limit stress.  Sit up straight, and do not tense your muscles.  Quit smoking if you smoke.  Limit alcohol use.  Decrease the amount of caffeine you drink, or stop drinking caffeine.  Eat and sleep on a regular schedule.  Get 7 to 9 hours of sleep, or as recommended by your caregiver.  Keep lights dim if bright lights bother you and make your headaches worse. SEEK MEDICAL CARE IF:   You have problems with the medicines you were prescribed.  Your medicines are not working.  You have a change from the usual headache.  You have nausea or vomiting. SEEK IMMEDIATE MEDICAL CARE IF:   Your headache becomes severe.  You have a fever.  You have a stiff neck.  You have loss of vision.  You have muscular weakness or loss of muscle control.  You start losing your balance or have trouble walking.  You feel faint or pass out.  You have severe symptoms that are different from your first symptoms. MAKE SURE YOU:   Understand these  instructions.  Will watch your condition.  Will get help right away if you are not doing well or get worse. Document Released: 08/02/2005 Document Revised: 10/25/2011 Document Reviewed: 08/18/2011 [facility] Patient Information 2015 [place], [place]. This information is not intended to replace advice given to you by your health care provider. Make sure you discuss any questions you have with your health care provider.  Upper Respiratory Infection, Adult An upper respiratory infection (URI) is also sometimes known as the common cold. The upper respiratory tract includes the nose, sinuses, throat, trachea, and bronchi. Bronchi are the airways leading to the lungs. Most people improve within 1 week, but symptoms can last up to 2 weeks. A residual cough may last even longer.  CAUSES Many different viruses can infect the tissues lining the upper respiratory tract. The tissues become irritated and inflamed and often become very moist. Mucus production is also common. A cold is contagious. You can easily spread the virus to others by oral contact. This includes kissing, sharing a glass, coughing, or sneezing. Touching your mouth or nose and then touching a surface, which is then touched by another person, can also spread the virus. SYMPTOMS  Symptoms typically develop 1 to 3 days after you come in contact with a cold virus. Symptoms vary from person to person. They may include:  Runny nose.  Sneezing.  Nasal congestion.  Sinus irritation.  Sore throat.  Loss of voice (laryngitis).  Cough.  Fatigue.  Muscle aches.  Loss of appetite.  Headache.  Low-grade fever. DIAGNOSIS  You might diagnose your own cold based on familiar symptoms, since most people get a cold 2 to 3 times a year. Your caregiver can confirm this based on your exam. Most importantly, your caregiver can check that your symptoms are not due to another disease such as strep throat, sinusitis, pneumonia, asthma, or  epiglottitis. Blood tests, throat tests, and X-rays are not necessary to diagnose a common cold, but they may sometimes be helpful in excluding other more serious diseases. Your caregiver will decide if any further tests are required. RISKS AND COMPLICATIONS  You may be at risk for a more severe case of the common cold if you smoke cigarettes, have chronic heart disease (such as heart failure) or lung disease (such as asthma), or if you have a weakened immune system. The very young and very old are also at risk for more serious infections. Bacterial sinusitis, middle ear infections, and bacterial pneumonia can complicate the common cold. The common cold can worsen asthma and chronic obstructive pulmonary disease (COPD). Sometimes, these complications can require emergency medical care and may be life-threatening. PREVENTION  The best way to protect against getting a cold is to practice good hygiene. Avoid oral or hand contact with people with cold symptoms. Wash your hands often if contact occurs. There is no clear evidence that vitamin C, vitamin E, echinacea, or exercise reduces the chance of developing a cold. However, it is always recommended to get plenty of rest and practice good nutrition. TREATMENT  Treatment is directed at relieving symptoms. There is no cure. Antibiotics are not effective, because the infection is caused by a virus, not by bacteria. Treatment may include:  Increased fluid intake. Sports drinks offer valuable electrolytes, sugars, and fluids.  Breathing heated mist or steam (vaporizer or shower).  Eating chicken soup or other clear broths, and maintaining good nutrition.  Getting plenty of rest.  Using gargles or lozenges for comfort.  Controlling fevers with ibuprofen or acetaminophen as directed by your caregiver.  Increasing usage of your inhaler if you have asthma. Zinc gel and zinc lozenges, taken in the first 24 hours of the common cold, can shorten the duration  and lessen the severity of symptoms. Pain medicines may help with fever, muscle aches, and throat pain. A variety of non-prescription medicines are available to treat congestion and runny nose. Your caregiver can make recommendations and may suggest nasal or lung inhalers for other symptoms.  HOME CARE INSTRUCTIONS   Only take over-the-counter or prescription medicines for pain, discomfort, or fever as directed by your caregiver.  Use a warm mist humidifier or inhale steam from a shower to increase air moisture. This may keep secretions moist and make it easier to breathe.  Drink enough water and fluids to keep your urine clear or pale yellow.  Rest as needed.  Return to work when your temperature has returned to normal or as your caregiver advises. You may need to stay home longer to avoid infecting others. You can also use a face mask and careful hand washing to prevent spread of the virus. SEEK MEDICAL CARE IF:   After the first few days, you feel you are getting worse rather than better.  You need your caregiver's advice about medicines to control symptoms.  You develop chills, worsening shortness of breath, or brown or red sputum. These may be signs of pneumonia.  You develop yellow or brown  nasal discharge or pain in the face, especially when you bend forward. These may be signs of sinusitis.  You develop a fever, swollen neck glands, pain with swallowing, or white areas in the back of your throat. These may be signs of strep throat. SEEK IMMEDIATE MEDICAL CARE IF:   You have a fever.  You develop severe or persistent headache, ear pain, sinus pain, or chest pain.  You develop wheezing, a prolonged cough, cough up blood, or have a change in your usual mucus (if you have chronic lung disease).  You develop sore muscles or a stiff neck. Document Released: 01/26/2001 Document Revised: 10/25/2011 Document Reviewed: 11/07/2013 [facility] Patient Information 2015 [place], [place]. This  information is not intended to replace advice given to you by your health care provider. Make sure you discuss any questions you have with your health care provider.  Sinusitis Sinusitis is redness, soreness, and inflammation of the paranasal sinuses. Paranasal sinuses are air pockets within the bones of your face (beneath the eyes, the middle of the forehead, or above the eyes). In healthy paranasal sinuses, mucus is able to drain out, and air is able to circulate through them by way of your nose. However, when your paranasal sinuses are inflamed, mucus and air can become trapped. This can allow bacteria and other germs to grow and cause infection. Sinusitis can develop quickly and last only a short time (acute) or continue over a long period (chronic). Sinusitis that lasts for more than 12 weeks is considered chronic.  CAUSES  Causes of sinusitis include:  Allergies.  Structural abnormalities, such as displacement of the cartilage that separates your nostrils (deviated septum), which can decrease the air flow through your nose and sinuses and affect sinus drainage.  Functional abnormalities, such as when the small hairs (cilia) that line your sinuses and help remove mucus do not work properly or are not present. SIGNS AND SYMPTOMS  Symptoms of acute and chronic sinusitis are the same. The primary symptoms are pain and pressure around the affected sinuses. Other symptoms include:  Upper toothache.  Earache.  Headache.  Bad breath.  Decreased sense of smell and taste.  A cough, which worsens when you are lying flat.  Fatigue.  Fever.  Thick drainage from your nose, which often is green and may contain pus (purulent).  Swelling and warmth over the affected sinuses. DIAGNOSIS  Your health care provider will perform a physical exam. During the exam, your health care provider may:  Look in your nose for signs of abnormal growths in your nostrils (nasal polyps).  Tap over the  affected sinus to check for signs of infection.  View the inside of your sinuses (endoscopy) using an imaging device that has a light attached (endoscope). If your health care provider suspects that you have chronic sinusitis, one or more of the following tests may be recommended:  Allergy tests.  Nasal culture. A sample of mucus is taken from your nose, sent to a lab, and screened for bacteria.  Nasal cytology. A sample of mucus is taken from your nose and examined by your health care provider to determine if your sinusitis is related to an allergy. TREATMENT  Most cases of acute sinusitis are related to a viral infection and will resolve on their own within 10 days. Sometimes medicines are prescribed to help relieve symptoms (pain medicine, decongestants, nasal steroid sprays, or saline sprays).  However, for sinusitis related to a bacterial infection, your health care provider will prescribe antibiotic medicines.  These are medicines that will help kill the bacteria causing the infection.  Rarely, sinusitis is caused by a fungal infection. In theses cases, your health care provider will prescribe antifungal medicine. For some cases of chronic sinusitis, surgery is needed. Generally, these are cases in which sinusitis recurs more than 3 times per year, despite other treatments. HOME CARE INSTRUCTIONS   Drink plenty of water. Water helps thin the mucus so your sinuses can drain more easily.  Use a humidifier.  Inhale steam 3 to 4 times a day (for example, sit in the bathroom with the shower running).  Apply a warm, moist washcloth to your face 3 to 4 times a day, or as directed by your health care provider.  Use saline nasal sprays to help moisten and clean your sinuses.  Take medicines only as directed by your health care provider.  If you were prescribed either an antibiotic or antifungal medicine, finish it all even if you start to feel better. SEEK IMMEDIATE MEDICAL CARE IF:  You  have increasing pain or severe headaches.  You have nausea, vomiting, or drowsiness.  You have swelling around your face.  You have vision problems.  You have a stiff neck.  You have difficulty breathing. MAKE SURE YOU:   Understand these instructions.  Will watch your condition.  Will get help right away if you are not doing well or get worse. Document Released: 08/02/2005 Document Revised: 12/17/2013 Document Reviewed: 08/17/2011 [facility] Patient Information 2015 [place], [place]. This information is not intended to replace advice given to you by your health care provider. Make sure you discuss any questions you have with your health care provider.

## 2014-11-17 NOTE — ED Notes (Signed)
To discharge patient once fluid infusion is complete per md request.

## 2014-11-17 NOTE — ED Provider Notes (Signed)
CSN: [id number]     Arrival date & time 11/16/14  2131 History   First MD Initiated Contact with Patient 11/16/14 2337     Chief Complaint  Patient presents with  . Headache     Patient is a 34 y.o. female presenting with headaches. The history is provided by the patient. No language interpreter was used.  Headache  Loretta Warren presents for evaluation of multiple complaints.  She got sick about a week and a half ago with cough, fevers.  Fevers have since resolved (a week ago).  She developed a headache described as fluid in her ears since Wednesday.  Pain is worse with coughing.  Pain is partially improved with BC powders.  She has associated nausea.  No vomiting.  Sxs are moderate, constant, worsening.  She has associated photophobia.   History reviewed. No pertinent past medical history. Past Surgical History  Procedure Laterality Date  . Tonsillectomy    . Cesarean section    . Dilation and curettage of uterus     History reviewed. No pertinent family history. History  Substance Use Topics  . Smoking status: Never Smoker   . Smokeless tobacco: Not on file  . Alcohol Use: Yes   OB History    No data available     Review of Systems  Neurological: Positive for headaches.  All other systems reviewed and are negative.     Allergies  Review of patient's allergies indicates no known allergies.  Home Medications   Prior to Admission medications   Medication Sig Start Date End Date Taking? Authorizing Provider  acetaminophen (TYLENOL) 500 MG tablet Take 2 tablets (1,000 mg total) by mouth every 8 (eight) hours as needed for pain or fever. 08/07/12  Yes John-Adam Bonk, MD  Aspirin-Salicylamide-Caffeine (BC HEADACHE POWDER PO) Take 0.5 tablets by mouth daily as needed (pain).   Yes Historical Provider, MD  diphenhydrAMINE (BENADRYL) 25 MG tablet Take 25-50 mg by mouth every 6 (six) hours as needed for sleep.    Yes Historical Provider, MD  ibuprofen (ADVIL,MOTRIN) 800 MG tablet  Take 1 tablet (800 mg total) by mouth every 8 (eight) hours as needed for pain or fever. 08/07/12  Yes John-Adam Bonk, MD  Norethin Ace-Eth Estrad-FE 1-20 MG-MCG(24) CHEW Chew 1 tablet by mouth daily.   Yes Historical Provider, MD  oxyCODONE-acetaminophen (PERCOCET/ROXICET) 5-325 MG per tablet Take 1-2 tablets by mouth every 6 (six) hours as needed for pain. 08/12/12  Yes Benjiman Core, MD  pseudoephedrine (SUDAFED) 60 MG tablet Take 1 tablet (60 mg total) by mouth every 6 (six) hours as needed for congestion. 08/07/12  Yes John-Adam Bonk, MD  norgestrel-ethinyl estradiol (LO/OVRAL,CRYSELLE) 0.3-30 MG-MCG tablet Take 1 tablet by mouth daily.    Historical Provider, MD   BP 111/70 mmHg  Pulse 114  Temp(Src) 98.2 F (36.8 C) (Oral)  Resp 18  Ht  (1.778 m)  Wt 143 lb (64.864 kg)  BMI 20.52 kg/m2  SpO2 93%  LMP 11/13/2014 (Approximate) Physical Exam  Constitutional: She is oriented to person, place, and time. She appears well-developed and well-nourished.  HENT:  Head: Normocephalic and atraumatic.  Right Ear: External ear normal.  Left Ear: External ear normal.  Mouth/Throat: Oropharynx is clear and moist.  Boggy nasal mucosa.  Eyes: Pupils are equal, round, and reactive to light.  Mild photophobia  Neck: Neck supple.  Cardiovascular: Normal rate and regular rhythm.   No murmur heard. Pulmonary/Chest: Effort normal and breath sounds normal. No respiratory distress.  Abdominal: Soft. There is no tenderness. There is no rebound and no guarding.  Musculoskeletal: She exhibits no edema or tenderness.  Lymphadenopathy:    She has no cervical adenopathy.  Neurological: She is alert and oriented to person, place, and time.  Skin: Skin is warm and dry.  Psychiatric: She has a normal mood and affect. Her behavior is normal.  Nursing note and vitals reviewed.   ED Course  Procedures (including critical care time) Labs Review Labs Reviewed - No data to display  Imaging  Review No results found.   EKG Interpretation None      MDM   Final diagnoses:  Upper respiratory infection, viral  Acute nonintractable headache, unspecified headache type  Acute maxillary sinusitis, recurrence not specified    Patient here for evaluation of cough, headache, congestion, ear popping. Patient is nontoxic appearing on exam. History of presentation is not consistent with acute otitis media, strep pharyngitis, meningitis, subarachnoid hemorrhage, pneumonia. Concern for potential sinusitis given symptoms, treated with Augmentin. Treating headache with hot cocktail. Pain to DC home with Augmentin, cough suppressants, decongestant. Discussed with patient PCP follow-up as well as return precautions.    Tilden FossaElizabeth Shadrack Brummitt, MD 11/17/14 [phone]

## 2024-07-07 ENCOUNTER — Other Ambulatory Visit: Payer: Self-pay

## 2024-07-07 ENCOUNTER — Encounter (HOSPITAL_COMMUNITY): Payer: Self-pay | Admitting: Pharmacy Technician

## 2024-07-07 ENCOUNTER — Emergency Department (HOSPITAL_COMMUNITY)
Admission: EM | Admit: 2024-07-07 | Discharge: 2024-07-07 | Disposition: A | Discharge status: Home / Self Care | Attending: Emergency Medicine | Admitting: Emergency Medicine

## 2024-07-07 ENCOUNTER — Emergency Department (HOSPITAL_COMMUNITY)

## 2024-07-07 DIAGNOSIS — W19XXXA Unspecified fall, initial encounter: Secondary | ICD-10-CM

## 2024-07-07 DIAGNOSIS — M25552 Pain in left hip: Secondary | ICD-10-CM | POA: Diagnosis not present

## 2024-07-07 DIAGNOSIS — Z7982 Long term (current) use of aspirin: Secondary | ICD-10-CM | POA: Insufficient documentation

## 2024-07-07 DIAGNOSIS — W109XXA Fall (on) (from) unspecified stairs and steps, initial encounter: Secondary | ICD-10-CM | POA: Insufficient documentation

## 2024-07-07 DIAGNOSIS — J45909 Unspecified asthma, uncomplicated: Secondary | ICD-10-CM | POA: Insufficient documentation

## 2024-07-07 DIAGNOSIS — Y9301 Activity, walking, marching and hiking: Secondary | ICD-10-CM | POA: Insufficient documentation

## 2024-07-07 DIAGNOSIS — R1022 Pelvic and perineal pain left side: Secondary | ICD-10-CM | POA: Diagnosis not present

## 2024-07-07 DIAGNOSIS — Y92009 Unspecified place in unspecified non-institutional (private) residence as the place of occurrence of the external cause: Secondary | ICD-10-CM | POA: Insufficient documentation

## 2024-07-07 DIAGNOSIS — R519 Headache, unspecified: Secondary | ICD-10-CM | POA: Diagnosis present

## 2024-07-07 DIAGNOSIS — S0990XA Unspecified injury of head, initial encounter: Secondary | ICD-10-CM | POA: Insufficient documentation

## 2024-07-07 DIAGNOSIS — M545 Low back pain, unspecified: Secondary | ICD-10-CM

## 2024-07-07 HISTORY — DX: Unspecified asthma, uncomplicated: J45.909

## 2024-07-07 MED ORDER — LIDOCAINE 5 % EX PTCH: 1.0000 | MEDICATED_PATCH | CUTANEOUS | 0 refills | Status: AC

## 2024-07-07 MED ORDER — METHOCARBAMOL 500 MG PO TABS
500.0000 mg | ORAL_TABLET | Freq: Once | ORAL | Status: AC
  Administered 2024-07-07: 500 mg via ORAL
  Filled 2024-07-07: qty 1

## 2024-07-07 MED ORDER — LIDOCAINE 5 % EX PTCH
1.0000 | MEDICATED_PATCH | CUTANEOUS | Status: DC
  Administered 2024-07-07: 1 via TRANSDERMAL
  Filled 2024-07-07: qty 1

## 2024-07-07 MED ORDER — OXYCODONE HCL 5 MG PO TABS: 5.0000 mg | ORAL_TABLET | Freq: Four times a day (QID) | ORAL | 0 refills | Status: AC | PRN

## 2024-07-07 MED ORDER — IBUPROFEN 800 MG PO TABS
800.0000 mg | ORAL_TABLET | Freq: Once | ORAL | Status: AC
  Administered 2024-07-07: 800 mg via ORAL
  Filled 2024-07-07: qty 1

## 2024-07-07 MED ORDER — METHOCARBAMOL 500 MG PO TABS: 500.0000 mg | ORAL_TABLET | Freq: Two times a day (BID) | ORAL | 0 refills | Status: AC

## 2024-07-07 MED ORDER — IBUPROFEN 800 MG PO TABS: 800.0000 mg | ORAL_TABLET | Freq: Three times a day (TID) | ORAL | 0 refills | Status: AC

## 2024-07-07 NOTE — ED Notes (Signed)
 Patient transported to CT

## 2024-07-07 NOTE — ED Triage Notes (Signed)
 Pt bib ems from home, was on 2nd story, went to walk downstairs, tripped down approx 6 steps hitting head, back and L flank. Denies LOC, no blood thinners. Pain with palpation to L hip. VSS with ems.

## 2024-07-07 NOTE — ED Provider Notes (Signed)
 Loretta Warren Provider Note   CSN: [id number] Arrival date & time: 07/07/24  0930     Patient presents with: Loretta Warren is a 43 y.o. female who presents emergency department with a chief complaint of mechanical fall.  Patient states that she lives in a two-story house that has approximately 6 stairs that goes down a staircase until a landing that is flat.  Patient states that she was at the top of the stairs and stepped down onto the first step and slipped when she was wearing her socks and fell hitting her back and gluteal region as well as the posterior portion of her head on the stairs sliding down to the landing.  Patient denies loss of consciousness, denies nausea or vomiting after the incident.  Denies blood thinning medications or visual disturbances.  Patient states that she attempted to take 1 step to get onto the stretcher after this incident but was unable to bear weight on her left lower extremity.  Patient appreciates left-sided hip/gluteal/back pain.  Patient also appreciates some mild posterior head pain but denies neck pain.  Denies significant past medical history other than asthma and takes no prescription medications at home.  Denies radiating numbness/tingling.    Fall       Prior to Admission medications   Medication Sig Start Date End Date Taking? Authorizing Provider  ibuprofen  (ADVIL ) 800 MG tablet Take 1 tablet (800 mg total) by mouth 3 (three) times daily. 07/07/24  Yes Markell Schrier F, PA-C  lidocaine  (LIDODERM ) 5 % Place 1 patch onto the skin daily. Remove & Discard patch within 12 hours or as directed by MD 07/07/24  Yes Shann Merrick F, PA-C  methocarbamol  (ROBAXIN ) 500 MG tablet Take 1 tablet (500 mg total) by mouth 2 (two) times daily. 07/07/24  Yes Mustaf Antonacci F, PA-C  oxyCODONE  (ROXICODONE ) 5 MG immediate release tablet Take 1 tablet (5 mg total) by mouth every 6 (six) hours as needed for  breakthrough pain. 07/07/24  Yes Yanely Mast F, PA-C  acetaminophen  (TYLENOL ) 500 MG tablet Take 2 tablets (1,000 mg total) by mouth every 8 (eight) hours as needed for pain or fever. 08/07/12   Bonk, Tanja, MD  amoxicillin -clavulanate (AUGMENTIN ) 875-125 MG per tablet Take 1 tablet by mouth every 12 (twelve) hours. 11/17/14   Griselda Norris, MD  Aspirin-Salicylamide-Caffeine [facility] [place] [facility] HEADACHE POWDER PO) Take 0.5 tablets by mouth daily as needed (pain).    [provider]  chlorpheniramine-HYDROcodone (TUSSIONEX PENNKINETIC ER) 10-8 MG/5ML LQCR Take 5 mLs by mouth every 12 (twelve) hours as needed for cough. 11/17/14   Griselda Norris, MD  diphenhydrAMINE  (BENADRYL ) 25 MG tablet Take 25-50 mg by mouth every 6 (six) hours as needed for sleep.     [provider]  Norethin Ace-Eth Estrad-FE 1-20 MG-MCG(24) CHEW Chew 1 tablet by mouth daily.    [provider]  norgestrel-ethinyl estradiol (LO/OVRAL,CRYSELLE) 0.3-30 MG-MCG tablet Take 1 tablet by mouth daily.    [provider]  pseudoephedrine  (SUDAFED) 60 MG tablet Take 1 tablet (60 mg total) by mouth every 6 (six) hours as needed for congestion. 08/07/12   Adelle Tanja, MD    Allergies: Patient has no known allergies.    Review of Systems  Musculoskeletal:  Positive for arthralgias (Left hip pain) and myalgias (Left-sided back pain, left gluteal pain).    Updated Vital Signs BP 100/76   Pulse 75   Temp 98 F (36.7 C)  Resp 17   SpO2 100%   Physical Exam Vitals and nursing note reviewed.  Constitutional:      General: She is awake. She is not in acute distress.    Appearance: Normal appearance. She is not ill-appearing, toxic-appearing or diaphoretic.  HENT:     Head: Normocephalic and atraumatic.  Eyes:     General: No scleral icterus.    Extraocular Movements: Extraocular movements intact.     Conjunctiva/sclera: Conjunctivae normal.     Pupils: Pupils are equal, round, and reactive to  light.  Pulmonary:     Effort: Pulmonary effort is normal. No respiratory distress.  Musculoskeletal:        General: Normal range of motion.     Cervical back: Normal range of motion. No tenderness.     Right lower leg: No edema.     Left lower leg: No edema.     Comments: Grossly normal range of motion of all 4 extremities, bilateral upper and lower extremities able to be raise against gravity as well as resistance, patient ambulatory without assistance  No cervical spine tenderness, no thoracic spine tenderness, no lumbar spine tenderness, left-sided lumbar paraspinal muscle tenderness, mild tenderness of left hip and pelvis, negative logroll extremity of bilateral lower extremities  Bilateral upper and lower extremities neurovascularly intact, compartment soft  Skin:    General: Skin is warm.     Capillary Refill: Capillary refill takes less than 2 seconds.  Neurological:     General: No focal deficit present.     Mental Status: She is alert and oriented to person, place, and time.     Sensory: No sensory deficit.     Motor: No weakness.     Coordination: Coordination normal.     Gait: Gait normal.  Psychiatric:        Mood and Affect: Mood normal.        Behavior: Behavior normal. Behavior is cooperative.     (all labs ordered are listed, but only abnormal results are displayed) Labs Reviewed  POC URINE PREG, ED    EKG: None  Radiology: CT Head Wo Contrast Result Date: 07/07/2024 EXAM: CT HEAD WITHOUT CONTRAST 07/07/2024 11:13:44 AM TECHNIQUE: CT of the head was performed without the administration of intravenous contrast. Automated exposure control, iterative reconstruction, and/or weight based adjustment of the mA/kV was utilized to reduce the radiation dose to as low as reasonably achievable. COMPARISON: None available. CLINICAL HISTORY: Posterior head pain, fall going downstairs, trauma to back of head. FINDINGS: BRAIN AND VENTRICLES: Cerebellar tonsils 7 mm below the  foramen magnum, suggesting a Chiari 1 malformation. Tonsils remain rounded and this may not be symptomatic. No acute hemorrhage. No evidence of acute infarct. No hydrocephalus. No extra-axial collection. No mass effect or midline shift. ORBITS: No acute abnormality. SINUSES: No acute abnormality. SOFT TISSUES AND SKULL: No acute soft tissue abnormality. No skull fracture. IMPRESSION: 1. No acute intracranial abnormality. 2. 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 Electronically signed by: Lonni Necessary MD 07/07/2024 11:25 AM EST RP Workstation: [id number]   DG Hip Unilat W or Wo Pelvis 2-3 Views Left Result Date: 07/07/2024 CLINICAL DATA:  L hip pain, post fall EXAM: DG HIP (WITH OR WITHOUT PELVIS) 2-3V LEFT COMPARISON:  None Available. FINDINGS: No acute fracture or dislocation. Small subcortical cyst along the lateral femoral head. No area of erosion or osseous destruction. No unexpected radiopaque foreign body. Soft tissues are  unremarkable. IMPRESSION: 1. No acute fracture or dislocation. 2. If there is a persistent clinical concern for nondisplaced hip or pelvic fracture, recommend dedicated pelvic CT or MRI. Electronically Signed   By: Corean Salter M.D.   On: 07/07/2024 10:50     Procedures   Medications Ordered in the ED  lidocaine  (LIDODERM ) 5 % 1 patch (1 patch Transdermal Patch Applied 07/07/24 1030)  ibuprofen  (ADVIL ) tablet 800 mg (800 mg Oral Given 07/07/24 1258)  methocarbamol  (ROBAXIN ) tablet 500 mg (500 mg Oral Given 07/07/24 1258)                                    Medical Decision Making Amount and/or Complexity of Data Reviewed Radiology: ordered.  Risk Prescription drug management.   Patient presents to the ED for concern of mechanical fall, this involves an extensive number of treatment options, and is a complaint that carries with it a high risk of complications  and morbidity.  The differential diagnosis includes left hip fracture, pelvic fracture, compression fracture, bulging disc, muscle strain/sprain, significant nerve compression, facial fracture, skull fracture, brain bleed, cervical spine injury, etc.   Co morbidities that complicate the patient evaluation  Asthma  Lab Tests:  I Ordered, and personally interpreted labs.  The pertinent results include: Patient unable to give urine attempt for pregnancy throughout stay   Imaging Studies ordered:  I ordered imaging studies including CT head, x-rays of pelvis/left hip I independently visualized and interpreted imaging which showed: CT head: No acute intracranial abnormality however findings consistent with possible Chari 1 malformation, patient educated on these findings X-rays of pelvis and left hip: No acute abnormality suggestive of fracture, deferred CT scan at this time as patient is ambulatory without assistance without significant pain in her hip or pelvis area, majority of pain is from left paraspinal muscle area of lumbar spine I agree with the radiologist interpretation   Medicines ordered and prescription drug management:  I ordered medication including ibuprofen , Robaxin , lidocaine  patches for pain Reevaluation of the patient after these medicines showed that the patient improved I have reviewed the patients home medicines and have made adjustments as needed   Test Considered:  MRI lumbar spine: Declined at this time as patient has no red flag back pain symptoms, denies groin numbness/tingling, bowel/bladder incontinence, no lower extremity weakness, patient amatory without assistance CT cervical spine: Canadian cervical spine scoring negative, patient has good range of motion of neck able to look left, right, touching the chest, and look up at ceiling, no direct cervical spine tenderness   Critical Interventions:  None   Problem List / ED Course:  43 year old female,  mechanical fall, head pain, left pelvic versus left hip pain, low back pain On physical exam patient is ambulatory without assistance, pain to left hip/left pelvis as well as low back worse over left paraspinal muscle area, no cervical or thoracic muscle tenderness, patient has good range of motion of neck able to look left, right, touching the chest, and look up at ceiling without significant discomfort Canadian neck CT negative, also Canadian head CT negative however patient requested CT scan Imaging reassuring showing no acute fractures or trauma, incidental finding of 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 1 malformation noted and patient made aware, patient understands to follow-up with her primary care provider regarding this Patient symptomatically treated with mild improvement in pain, given outpatient referral information for [facility] Return precautions given Patient discharged  Most likely diagnosis at this time is mechanical fall leading to soft tissue injury versus muscle sprain/strain of left lower back as well as left gluteal area, no red flag back pain symptoms   Reevaluation:  After the interventions noted above, I reevaluated the patient and found that they have :improved   Social Determinants of Health:  None   Dispostion:  After consideration of the diagnostic results and the patients response to treatment, I feel that the patent would benefit from discharge and outpatient therapy as described, follow-up with primary care provider.     Final diagnoses:  Fall, initial encounter  Acute left-sided low back pain without sciatica  Injury of head, initial encounter    ED Discharge Orders          Ordered    ibuprofen  (ADVIL ) 800 MG tablet  3 times daily        07/07/24 1259    methocarbamol  (ROBAXIN ) 500 MG tablet  2 times daily        07/07/24 1259    lidocaine  (LIDODERM ) 5 %  Every 24 hours        07/07/24 1259    oxyCODONE  (ROXICODONE ) 5 MG immediate release tablet  Every 6  hours PRN        07/07/24 1327               Josua Ferrebee F, PA-C 07/07/24 1558    Laurice Maude BROCKS, MD 07/08/24 [phone]

## 2024-07-07 NOTE — ED Notes (Signed)
 Patient Alert and oriented to baseline. Stable and ambulatory to baseline. Patient verbalized understanding of the discharge instructions.  Patient belongings were taken by the patient.

## 2024-07-07 NOTE — Discharge Instructions (Addendum)
 It was a pleasure taking care of you today.  Based on your history, physical exam, labs, as well as imaging I feel you are safe for discharge.  Today the imaging completed of your head and your pelvis as well as hips was reassuring.  There was no evidence of acute injury related to your fall today.  Based off of where your pain is on your physical exam I am suspicious that you have a muscle strain/sprain versus a bad bruise.  Because of this I have sent in a few medications to the pharmacy.  Please do not take over-the-counter ibuprofen  with your prescription strength ibuprofen  as they are in the same medication class.  Robaxin  is a muscle relaxant medication, please take this as prescribed however please do not drive or operate heavy machinery after taking it as it may make you drowsy, I have also sent in some lidocaine  patches.  I have given you the follow-up information for [facility] or you can follow-up if symptoms persist.  If you experiencing the following symptoms including but not limited to inability to walk, unexplained weakness, radiating numbness/tingling, groin numbness/tingling, issues with bowel/bladder incontinence or retention, severe headache, vision changes, issues with balance or coordination, excessive nausea/vomiting, or other concerning symptom please return to the emergency department or seek further medical care.  Please do not drive or operate heavy machinery after taking the oxycodone  as it can make you drowsy.  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  Please make your primary care provider aware of this as soon as possible.
